# Patient Record
Sex: Male | Born: 1950 | Race: White | Hispanic: No | Marital: Married
Health system: Southern US, Community
[De-identification: ages and names within clinical notes are randomized; demographics above are authoritative.]

---

## 2007-05-19 ENCOUNTER — Ambulatory Visit: Payer: Self-pay | Admitting: Emergency Medicine

## 2011-02-04 ENCOUNTER — Ambulatory Visit: Payer: Self-pay

## 2012-03-14 IMAGING — CR DG CHEST 2V
1 series · 2 of 2 positions shown · non-contrast
Comparison: none

REASON FOR EXAM: cough; decreased SAO2
COMMENTS:   LMP: (Male)

[Series 1: view not recorded · 0.17mm/px · 2 of 2 slices shown]
[im 1/2]
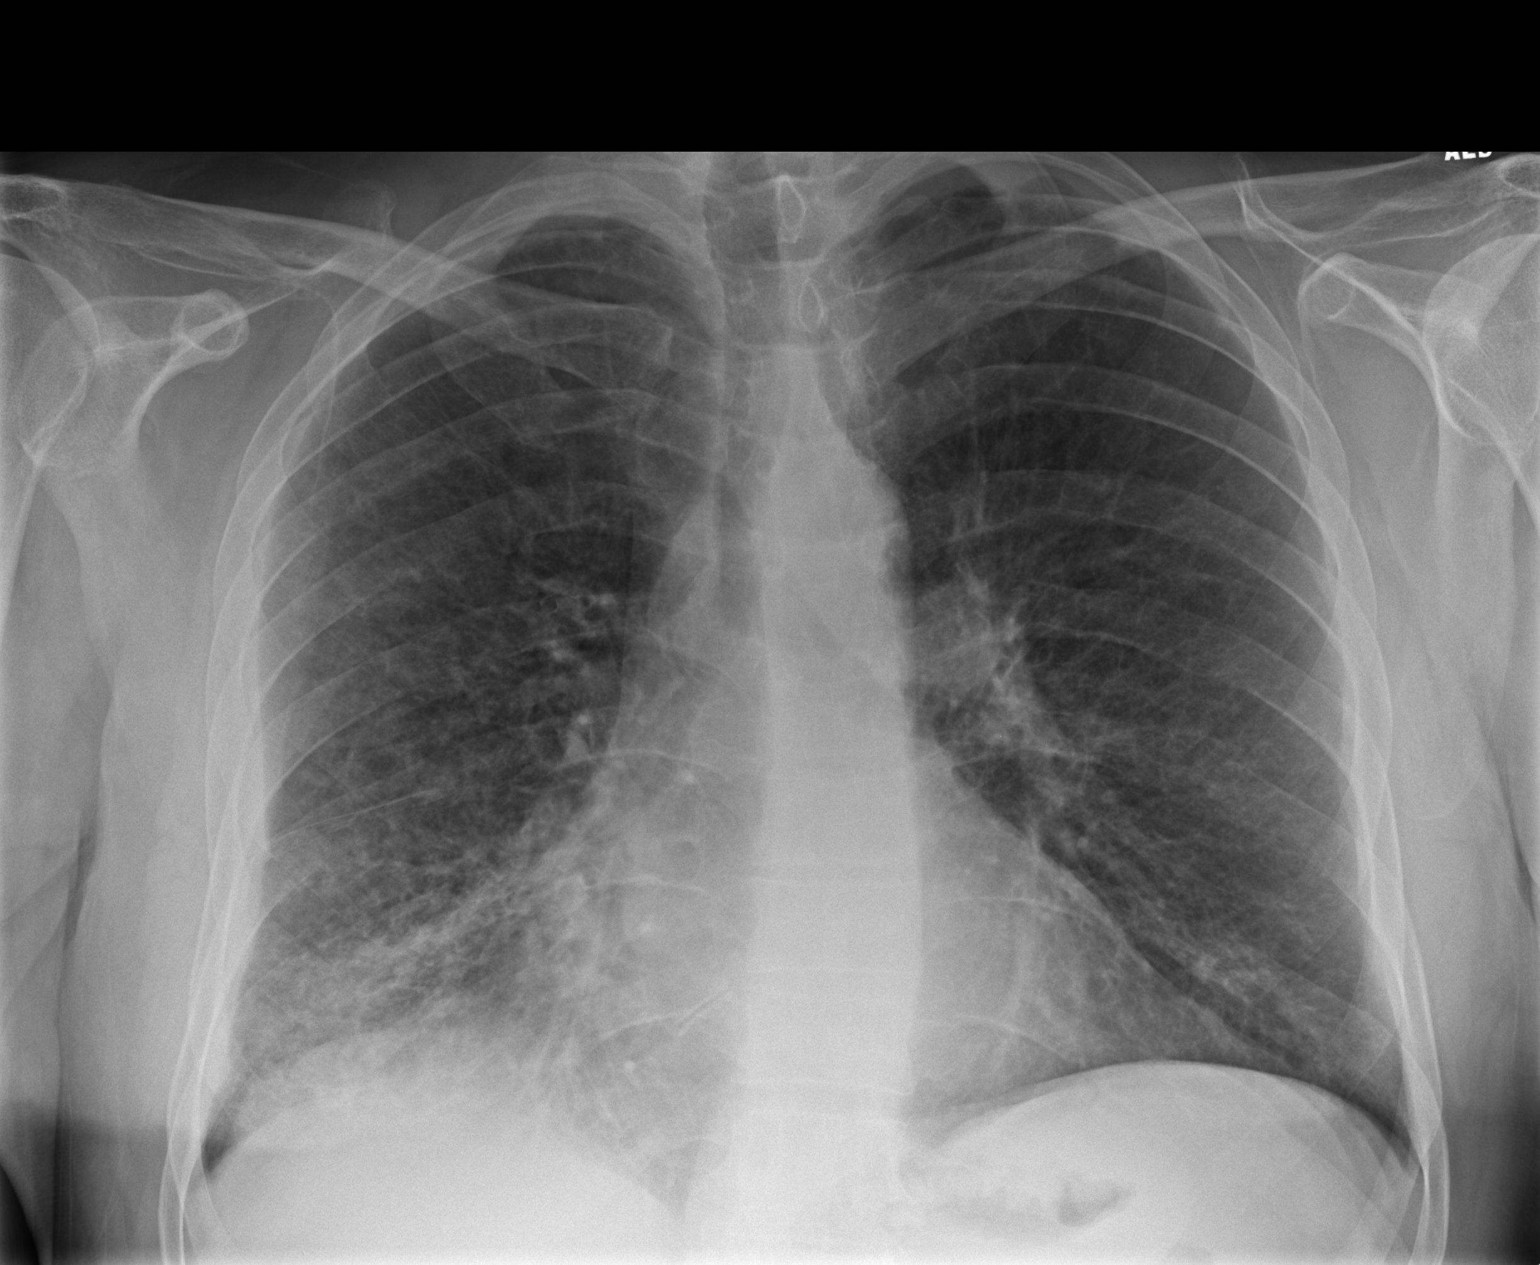
[im 2/2]
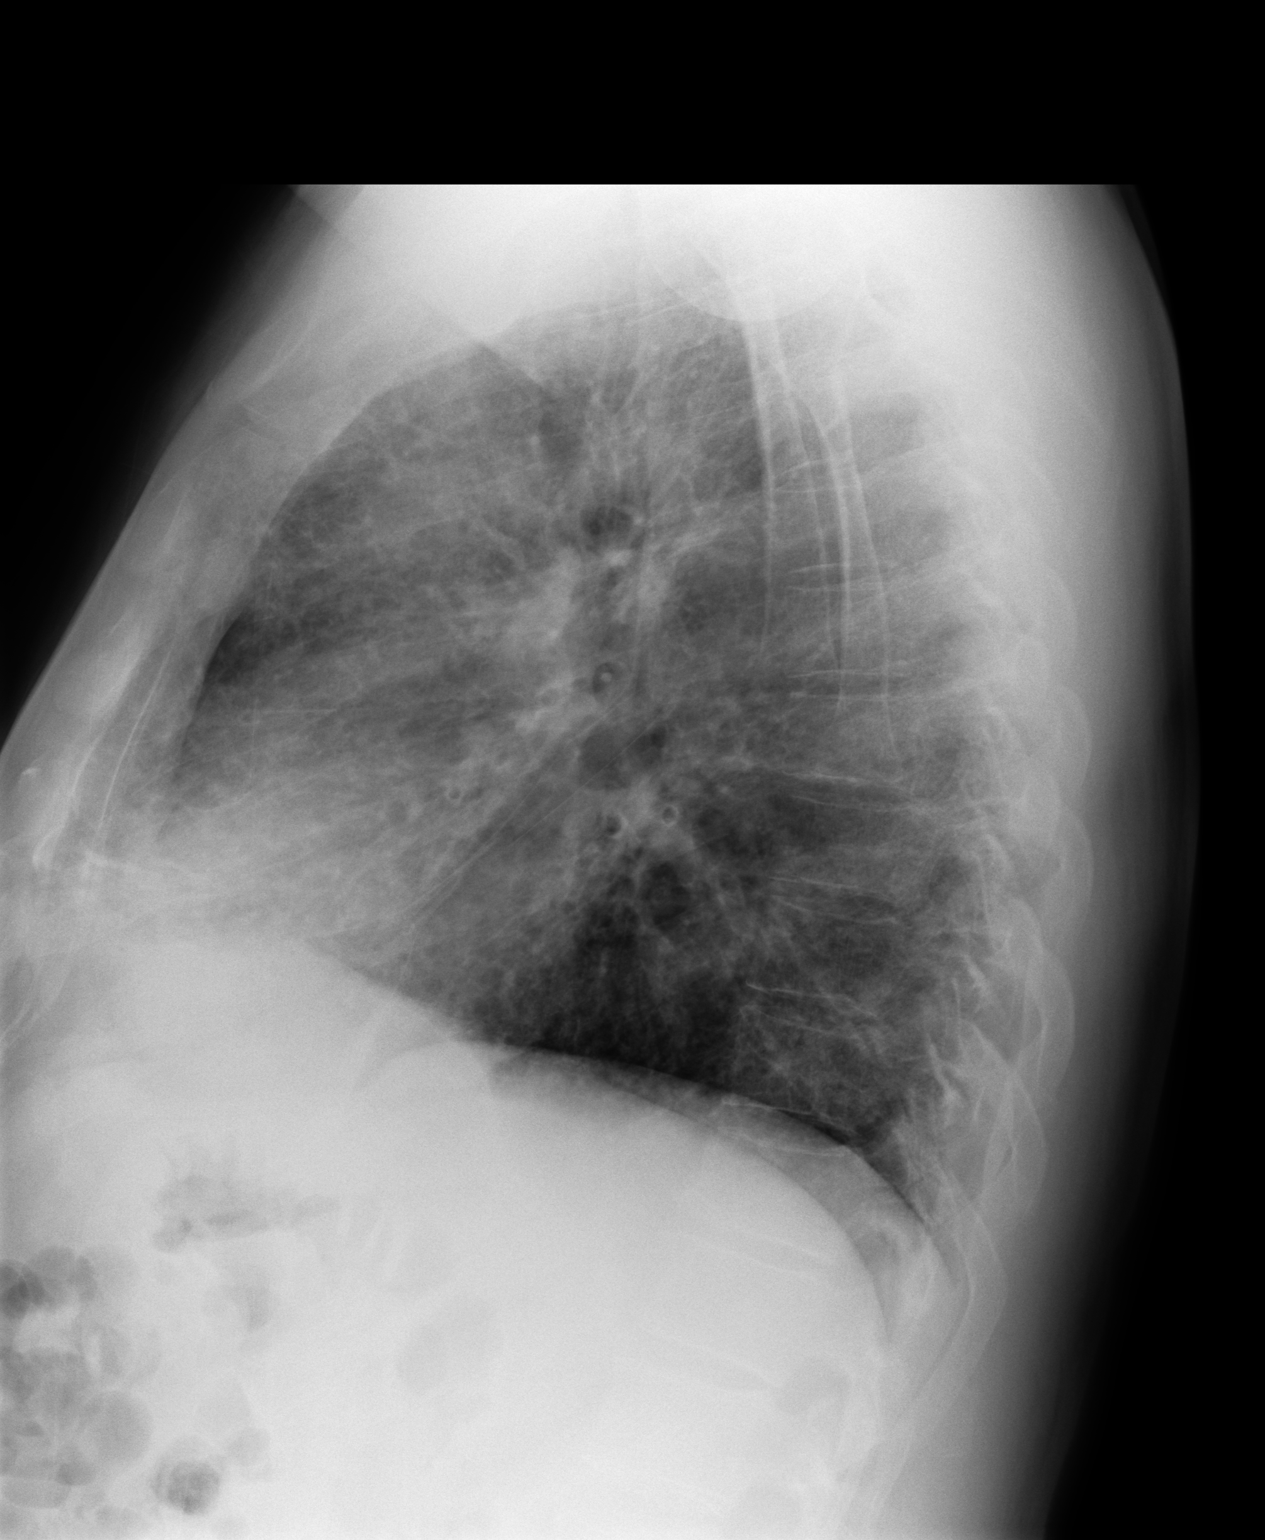

[2 of 2 positions shown; findings below may reference images not displayed]

PROCEDURE:     MDR - MDR CHEST PA(OR AP) AND LATERAL  - February 04, 2011  [DATE]

RESULT:     There are no prior examinations for comparison. There is
observed increased density in the right lower lung field, most compatible
with pneumonia. Fibrosis conceivably could produce this appearance but at
this point pneumonia would be the first consideration. The left lung field
is clear. No acute changes of the heart or pulmonary vasculature are seen.
IMPRESSION: 1. There is increased density in the right lower lung field consistent with
pneumonia and most likely in the right middle lobe.
2. Follow-up examination until clear is recommended.

## 2012-09-24 DIAGNOSIS — J849 Interstitial pulmonary disease, unspecified: Secondary | ICD-10-CM | POA: Insufficient documentation

## 2012-09-24 DIAGNOSIS — J439 Emphysema, unspecified: Secondary | ICD-10-CM | POA: Insufficient documentation

## 2017-02-13 DIAGNOSIS — H9193 Unspecified hearing loss, bilateral: Secondary | ICD-10-CM | POA: Insufficient documentation

## 2017-02-13 DIAGNOSIS — K7031 Alcoholic cirrhosis of liver with ascites: Secondary | ICD-10-CM | POA: Insufficient documentation

## 2017-02-13 DIAGNOSIS — K2210 Ulcer of esophagus without bleeding: Secondary | ICD-10-CM | POA: Insufficient documentation

## 2017-04-15 DIAGNOSIS — G250 Essential tremor: Secondary | ICD-10-CM | POA: Insufficient documentation

## 2018-01-29 DIAGNOSIS — K439 Ventral hernia without obstruction or gangrene: Secondary | ICD-10-CM | POA: Insufficient documentation

## 2018-05-02 DIAGNOSIS — K4090 Unilateral inguinal hernia, without obstruction or gangrene, not specified as recurrent: Secondary | ICD-10-CM | POA: Insufficient documentation

## 2019-07-08 DIAGNOSIS — K766 Portal hypertension: Secondary | ICD-10-CM | POA: Insufficient documentation

## 2019-08-26 DIAGNOSIS — D696 Thrombocytopenia, unspecified: Secondary | ICD-10-CM | POA: Insufficient documentation

## 2019-11-20 DIAGNOSIS — C3490 Malignant neoplasm of unspecified part of unspecified bronchus or lung: Secondary | ICD-10-CM | POA: Insufficient documentation

## 2019-11-20 DIAGNOSIS — Z87891 Personal history of nicotine dependence: Secondary | ICD-10-CM | POA: Insufficient documentation

## 2019-11-20 DIAGNOSIS — K652 Spontaneous bacterial peritonitis: Secondary | ICD-10-CM | POA: Insufficient documentation

## 2019-11-21 DIAGNOSIS — I2510 Atherosclerotic heart disease of native coronary artery without angina pectoris: Secondary | ICD-10-CM | POA: Insufficient documentation

## 2019-11-25 DIAGNOSIS — R109 Unspecified abdominal pain: Secondary | ICD-10-CM | POA: Insufficient documentation

## 2019-11-30 ENCOUNTER — Encounter: Payer: Self-pay | Admitting: Primary Care

## 2019-11-30 NOTE — Progress Notes (Signed)
This encounter was created in error - please disregard.

## 2019-12-01 ENCOUNTER — Telehealth: Payer: Self-pay | Admitting: Primary Care

## 2019-12-01 ENCOUNTER — Other Ambulatory Visit: Payer: Self-pay | Admitting: Primary Care

## 2019-12-01 ENCOUNTER — Other Ambulatory Visit: Payer: Self-pay

## 2019-12-01 DIAGNOSIS — Z8042 Family history of malignant neoplasm of prostate: Secondary | ICD-10-CM | POA: Insufficient documentation

## 2019-12-01 DIAGNOSIS — C3432 Malignant neoplasm of lower lobe, left bronchus or lung: Secondary | ICD-10-CM

## 2019-12-01 DIAGNOSIS — F1011 Alcohol abuse, in remission: Secondary | ICD-10-CM | POA: Insufficient documentation

## 2019-12-01 DIAGNOSIS — Z515 Encounter for palliative care: Secondary | ICD-10-CM

## 2019-12-01 DIAGNOSIS — Z9889 Other specified postprocedural states: Secondary | ICD-10-CM | POA: Insufficient documentation

## 2019-12-01 NOTE — Progress Notes (Signed)
[facility] Consult Note Telephone: [phone]  Fax: [phone]  PATIENT NAME: Steven Mahoney [address] [place] [address] [phone] (home)  DOB: April 05, 1951 MRN: [id number]  PRIMARY CARE PROVIDER:   Elaina Pattee, MD [address] [place] [address]  [phone]   REFERRING PROVIDER:   Elwin Mocha, [place] [address] [place],  [place] [address] [phone]   RESPONSIBLE PARTY:  Extended Emergency Contact Information Primary Emergency Contact: Justn, Quale Mobile Phone: [phone] Relation: None Secondary Emergency Contact: ALLEN,GAIL A Address: [address]          [place],  [address] Home Phone: [phone] Relation: None    I met with patient and family in home.Crystal Dickison is pt's daughter.  ASSESSMENT AND RECOMMENDATIONS:   1. Advance Care Planning/Goals of Care: Goals include to maximize quality of life and symptom management. Our advance care planning conversation included a discussion about disease process and need for hospice admission for symptom control. I outlined hospice services and how 24/7 support could benefit him. He and his daughter decided to accept hospice admission visit for tomorrow am.  2. Symptom Management:   I met with Steven Mahoney and his daughter at his home. This referral is from Dr. Sharlynn Oliphant MD at [facility] palliative. 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 He has been reticent for home hospice admission up until this point.  Pain: His daughter called this afternoon concerned about a pain crisis. I was able to make a visit to his home. He indeed has significant pain 10 /10 prior to dosing and five / 10 after. He has been taking his Dilaudid 2 mg and then following an hour later with another 2 mg. I instructed him to take 4 mg at a time and every three  to  four hours depending on his pain control. I  would recommend dosing with a long acting if possible.  Constipation: has been a problem and he has been treating with lactulose with poor results. His daughter has  gotten magnesium citrate, which he will take this evening. I reinitiated his MiraLAX daily or b.i.d. if needed, and asked him to begin senna two tablets b.i.d. Can titrate to effect. He may also take the lactulose if needed but I did instruct him that he would likely need several different bowel preparations. This is complicated by liver disease, portal htn and sma.  Nutrition has been poor due to nausea and early satiety. Albumin 2.3 . Was dx in hospital with superior mesenteric  artery syndrome. His daughter said that he had wanted to go to [employer] but then was not able to eat anything. He is also taking sublingual ondansetron PO. I have called in tablets if he prefers to swallow these, which he stated he did due to the bad taste of the ODT.   Hospice services: I explained the mission of hospice and the philosophy.  He is willing now to accept hospice admission for symptom management. His goals are to be as comfortable as possible, and remain in his home. He was afraid that he had to leave and go elsewhere. His daughter is also caring for her mother who has Alzheimer's and is  as well under hospice. I have put in a referral for admission in the morning and referral center has schedule an appointment. They both appeared to be relieved  at the support and felt that it was appropriate time to commit to hospice services.  3. Family /Caregiver/Community Supports:  [place] Daughter is caregiver.   4. Cognitive / Functional decline: A  And O x 3, needs help with most adls, iadls. Weakness from disease progression.  5. Follow up Palliative Care Visit: Palliative care will refer to hospice  for goals of care clarification and symptom management. Return  Prn if not admitted to  hospice  I spent 75 minutes providing this consultation,  from 1700 to 1815. More than 50% of the time in this consultation was spent coordinating communication.   HISTORY OF PRESENT ILLNESS:  Steven Mahoney is a 69 y.o. year old male with multiple medical problems including metastatic SCLC, alcohol dependence,constipation, liver disease, ascites, uncontrolled pain . Palliative Care was asked to follow this patient by consultation request of Dr Landry Mellow to help address advance care planning and goals of care. This is the initial  visit.  CODE STATUS: TBD  PPS: 30%  HOSPICE ELIGIBILITY/DIAGNOSIS: yes/small cell lung cancer  PAST MEDICAL HISTORY: Small cell lung ca, SMA, tobacco dependence, alcohol dependence, ascites, liver disease.  SOCIAL HX:  Social History   Tobacco Use  . Smoking status: Not on file  Substance Use Topics  . Alcohol use: Not on file    ALLERGIES: Not on File   PERTINENT MEDICATIONS:  Outpatient Encounter Medications as of 12/01/2019  Medication Sig  . albuterol (VENTOLIN HFA) 108 (90 Base) MCG/ACT inhaler Inhale 2 puffs into the lungs every 6 (six) hours as needed.  Marland Kitchen HYDROmorphone (DILAUDID) 2 MG tablet Take 2 mg by mouth every 3 (three) hours as needed for severe pain. Take 1-2 tablets as needed for pain  . lactulose (CHRONULAC) 10 GM/15ML solution Take 15 mLs by mouth 3 (three) times daily.  . ondansetron (ZOFRAN) 4 MG tablet Take 4 mg by mouth every 8 (eight) hours as needed for nausea or vomiting.  . polyethylene glycol (MIRALAX / GLYCOLAX) 17 g packet Take 17 g by mouth daily.  Marland Kitchen senna (SENOKOT) 8.6 MG tablet Take 2 tablets by mouth 2 (two) times daily.  Marland Kitchen aspirin 81 MG EC tablet Take 1 tablet by mouth.  Arne Cleveland 5 MG TABS tablet Take 5 mg by mouth 2 (two) times daily.  . furosemide (LASIX) 20 MG tablet Take 40 mg by mouth daily.  . ondansetron (ZOFRAN-ODT) 4 MG disintegrating tablet Take 4 mg by mouth every 8 (eight) hours as needed.   No  facility-administered encounter medications on file as of 12/01/2019.    PHYSICAL EXAM / ROS:   Current and past weights: 212 lbs ( with ascites) General: NAD, frail appearing,  endorses 5-10/10 pain Cardiovascular: no chest pain reported, no edema in LE Pulmonary: no cough, no increased SOB, room air Abdomen: appetite poor, Albumin 2.3,endorses severe constipation, mild to moderate ascites, continent of bowel GU: denies dysuria, continent of urine MSK:  + joint and ROM abnormalities, ambulatory with assistance Skin: no rashes or wounds reported Neurological: Weakness, reported pain 11/23/2019: CT abdomen pelvis at [facility]: IMPRESSION: 1. No evidence of bowel obstruction. 2. 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  3. Worsening metastatic disease since 11/01/2019 as follows: Within the chest, there is increasing mediastinal and left hilar lymphadenopathy, new pulmonary nodules, and new left pleural and paraspinal metastases with small malignant left pleural effusion. Within the abdomen, there is markedly worsening  retroperitoneal lymphadenopathy and stable right inguinal lymphadenopathy. 4. 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   Jason Coop, NP [facility]  COVID-19 PATIENT SCREENING TOOL  Person answering questions: ____________Self_______ _____   1.  Is the patient or any family member in the home showing any signs or symptoms regarding respiratory infection?               Person with Symptom- __________NA_________________  a. Fever                                                                          Yes___ No___          ___________________  b. Shortness of breath                                                    Yes___ No___           ___________________ c. Cough/congestion                                       Yes___  No___         ___________________ d. Body aches/pains                                                         Yes___ No___        ____________________ e. Gastrointestinal symptoms (diarrhea, nausea)           Yes___ No___        ____________________  2. Within the past 14 days, has anyone living in the home had any contact with someone with or under investigation for COVID-19?    Yes___ No_X_   Person __________________

## 2019-12-01 NOTE — Telephone Encounter (Signed)
Rec'd call from daughter, Donella Stade, stating that she had briefly spoken with the NP and she was calling me back to schedule the Palliative Consult.  I have scheduled an In-person Consult for 12/05/19 @ 11 AM

## 2019-12-05 ENCOUNTER — Other Ambulatory Visit: Payer: Self-pay | Admitting: Primary Care

## 2019-12-22 DEATH — deceased
